# Patient Record
Sex: Female | Born: 1965 | Race: White | Hispanic: No | Marital: Married | Smoking: Never smoker
Health system: Southern US, Community
[De-identification: ages and names within clinical notes are randomized; demographics above are authoritative.]

## PROBLEM LIST (undated history)

## (undated) DIAGNOSIS — Z789 Other specified health status: Secondary | ICD-10-CM

## (undated) HISTORY — PX: WISDOM TOOTH EXTRACTION: SHX21

---

## 2005-02-18 ENCOUNTER — Ambulatory Visit: Payer: Self-pay

## 2009-11-20 ENCOUNTER — Ambulatory Visit: Payer: Self-pay

## 2012-02-27 ENCOUNTER — Ambulatory Visit: Payer: Self-pay | Admitting: Obstetrics and Gynecology

## 2014-02-18 LAB — HM PAP SMEAR: HM Pap smear: NEGATIVE

## 2014-04-08 ENCOUNTER — Ambulatory Visit: Payer: Self-pay | Admitting: Obstetrics and Gynecology

## 2014-11-28 ENCOUNTER — Other Ambulatory Visit: Payer: Self-pay | Admitting: Obstetrics and Gynecology

## 2014-11-28 ENCOUNTER — Telehealth: Payer: Self-pay | Admitting: Obstetrics and Gynecology

## 2014-11-28 DIAGNOSIS — D229 Melanocytic nevi, unspecified: Secondary | ICD-10-CM

## 2014-11-28 DIAGNOSIS — O4100X0 Oligohydramnios, unspecified trimester, not applicable or unspecified: Secondary | ICD-10-CM

## 2014-11-28 NOTE — Telephone Encounter (Signed)
Please let her know it was sent in

## 2014-11-28 NOTE — Telephone Encounter (Signed)
This pt left a msg that she was going to dr Dionne Bucy to get a mole removed and she needs [place] to send a referral ove rso her insurance will pay. Her appt is 9/30 @ 915.    (to my knowledge I thought the primary dr had to do referral... Robin)

## 2015-02-20 ENCOUNTER — Encounter: Payer: Self-pay | Admitting: *Deleted

## 2015-02-24 ENCOUNTER — Encounter: Payer: Self-pay | Admitting: Obstetrics and Gynecology

## 2015-02-24 ENCOUNTER — Ambulatory Visit (INDEPENDENT_AMBULATORY_CARE_PROVIDER_SITE_OTHER): Payer: 59 | Admitting: Obstetrics and Gynecology

## 2015-02-24 VITALS — BP 99/66 | HR 94 | Ht 65.0 in | Wt 151.0 lb

## 2015-02-24 DIAGNOSIS — Z01419 Encounter for gynecological examination (general) (routine) without abnormal findings: Secondary | ICD-10-CM | POA: Diagnosis not present

## 2015-02-24 NOTE — Progress Notes (Signed)
Subjective:   Teresa Weaver is a 49 y.o. G2P0 Caucasian female here for a routine well-woman exam.  Patient's last menstrual period was 02/08/2015.    Current complaints: skipping menses x 4 months with hot flashes PCP: me       Does need labs  Social History: Sexual: heterosexual Marital Status: married Living situation: with spouse Occupation: hairdresser Tobacco/alcohol: no tobacco use Illicit drugs: no history of illicit drug use  The following portions of the patient's history were reviewed and updated as appropriate: allergies, current medications, past family history, past medical history, past social history, past surgical history and problem list.  Past Medical History History reviewed. No pertinent past medical history.  Past Surgical History History reviewed. No pertinent past surgical history.  Gynecologic History G2P0  Patient's last menstrual period was 02/08/2015. Contraception: vasectomy Last Pap: 2015. Results were: normal Last mammogram: 2016. Results were: normal   Obstetric History OB History  Gravida Para Term Preterm AB SAB TAB Ectopic Multiple Living  2         2    # Outcome Date GA Lbr Len/2nd Weight Sex Delivery Anes PTL Lv  2 Gravida      Vag-Spont   Y  1 Gravida      Vag-Spont   Y      Current Medications No current outpatient prescriptions on file prior to visit.   No current facility-administered medications on file prior to visit.    Review of Systems Patient denies any headaches, blurred vision, shortness of breath, chest pain, abdominal pain, problems with bowel movements, urination, or intercourse.  Objective:  BP 99/66 mmHg  Pulse 94  Ht 5\' 5"  (1.651 m)  Wt 151 lb (68.493 kg)  BMI 25.13 kg/m2  LMP 02/08/2015 Physical Exam  General:  Well developed, well nourished, no acute distress. She is alert and oriented x3. Skin:  Warm and dry Neck:  Midline trachea, no thyromegaly or nodules Cardiovascular: Regular rate and  rhythm, no murmur heard Lungs:  Effort normal, all lung fields clear to auscultation bilaterally Breasts:  No dominant palpable mass, retraction, or nipple discharge Abdomen:  Soft, non tender, no hepatosplenomegaly or masses Pelvic:  External genitalia is normal in appearance.  The vagina is normal in appearance. The cervix is bulbous, no CMT.  Thin prep pap is not done . Uterus is felt to be normal size, shape, and contour.  No adnexal masses or tenderness noted. Extremities:  No swelling or varicosities noted Psych:  She has a normal mood and affect  Assessment:   Healthy well-woman exam Perimenopausal bleeding Elevated cholesterol  Plan:  Labs obtained F/U 1 year for AE, or sooner if needed Mammogram scheduled  Paz Fuentes Rockney Ghee, CNM

## 2015-02-24 NOTE — Patient Instructions (Signed)
  Place annual gynecologic exam patient instructions here.  Thank you for enrolling in [facility]. Please follow the instructions below to securely access your online medical record. MyChart allows you to send messages to your doctor, view your test results, manage appointments, and more.   How Do I Sign Up? 1. In your Internet browser, go to [employer] and enter [email or web address]. 2. Click on the Sign Up Now link in the Sign In box. You will see the New Member Sign Up page. 3. Enter your MyChart Access Code exactly as it appears below. You will not need to use this code after you've completed the sign-up process. If you do not sign up before the expiration date, you must request a new code.  MyChart Access Code: [email or web address] Expires: 04/11/2015 10:46 AM  4. Enter your Social Security Number ([id number]) and Date of Birth (mm/dd/yyyy) as indicated and click Submit. You will be taken to the next sign-up page. 5. Create a MyChart ID. This will be your MyChart login ID and cannot be changed, so think of one that is secure and easy to remember. 6. Create a MyChart password. You can change your password at any time. 7. Enter your Password Reset Question and Answer. This can be used at a later time if you forget your password.  8. Enter your e-mail address. You will receive e-mail notification when new information is available in [facility]. 9. Click Sign Up. You can now view your medical record.   Additional Information Remember, MyChart is NOT to be used for urgent needs. For medical emergencies, dial 911.

## 2015-02-25 LAB — COMPREHENSIVE METABOLIC PANEL
A/G RATIO: 2 (ref 1.1–2.5)
ALT: 24 IU/L (ref 0–32)
AST: 19 IU/L (ref 0–40)
Albumin: 4.6 g/dL (ref 3.5–5.5)
Alkaline Phosphatase: 72 IU/L (ref 39–117)
BUN/Creatinine Ratio: 18 (ref 9–23)
BUN: 12 mg/dL (ref 6–24)
Bilirubin Total: <0.2 mg/dL (ref 0.0–1.2)
CALCIUM: 9.6 mg/dL (ref 8.7–10.2)
CO2: 22 mmol/L (ref 18–29)
Chloride: 105 mmol/L (ref 96–106)
Creatinine, Ser: 0.66 mg/dL (ref 0.57–1.00)
GFR calc Af Amer: 120 mL/min/{1.73_m2} (ref >59)
GFR, EST NON AFRICAN AMERICAN: 104 mL/min/{1.73_m2} (ref >59)
GLUCOSE: 101 mg/dL — AB (ref 65–99)
Globulin, Total: 2.3 g/dL (ref 1.5–4.5)
POTASSIUM: 4.4 mmol/L (ref 3.5–5.2)
Sodium: 144 mmol/L (ref 134–144)
Total Protein: 6.9 g/dL (ref 6.0–8.5)

## 2015-02-25 LAB — LIPID PANEL
CHOL/HDL RATIO: 6.6 ratio — AB (ref 0.0–4.4)
Cholesterol, Total: 316 mg/dL — ABNORMAL HIGH (ref 100–199)
HDL: 48 mg/dL (ref >39)
LDL CALC: 214 mg/dL — AB (ref 0–99)
Triglycerides: 270 mg/dL — ABNORMAL HIGH (ref 0–149)
VLDL Cholesterol Cal: 54 mg/dL — ABNORMAL HIGH (ref 5–40)

## 2015-02-28 ENCOUNTER — Other Ambulatory Visit: Payer: Self-pay | Admitting: Obstetrics and Gynecology

## 2015-02-28 DIAGNOSIS — E782 Mixed hyperlipidemia: Secondary | ICD-10-CM

## 2015-03-14 ENCOUNTER — Telehealth: Payer: Self-pay | Admitting: *Deleted

## 2015-03-14 NOTE — Telephone Encounter (Signed)
Notified pt of her lab results, mailed them to pt

## 2015-03-14 NOTE — Telephone Encounter (Signed)
-----   Message from Joylene Igo, [place] sent at 02/28/2015  3:44 PM EST ----- Please let her know chol was NOT good. I know she wasn't fasting, but still shouldn't be this high. I want her to work hard on lowering daily cholesterol intake and weight loss, and come by for lab draw (fasting ) in 3 months to recheck. If levels don't come down, we may have to consider cholesterol medication.

## 2016-02-05 ENCOUNTER — Ambulatory Visit
Admission: RE | Admit: 2016-02-05 | Discharge: 2016-02-05 | Disposition: A | Discharge status: Home / Self Care | Payer: BLUE CROSS/BLUE SHIELD | Source: Ambulatory Visit | Attending: Obstetrics and Gynecology | Admitting: Obstetrics and Gynecology

## 2016-02-05 DIAGNOSIS — Z1231 Encounter for screening mammogram for malignant neoplasm of breast: Secondary | ICD-10-CM | POA: Insufficient documentation

## 2016-02-05 DIAGNOSIS — Z01419 Encounter for gynecological examination (general) (routine) without abnormal findings: Secondary | ICD-10-CM | POA: Diagnosis not present

## 2016-03-01 ENCOUNTER — Encounter: Payer: 59 | Admitting: Obstetrics and Gynecology

## 2016-05-24 ENCOUNTER — Encounter: Payer: Self-pay | Admitting: Obstetrics and Gynecology

## 2016-08-02 ENCOUNTER — Encounter: Payer: Self-pay | Admitting: Obstetrics and Gynecology

## 2016-08-02 ENCOUNTER — Ambulatory Visit (INDEPENDENT_AMBULATORY_CARE_PROVIDER_SITE_OTHER): Payer: BLUE CROSS/BLUE SHIELD | Admitting: Obstetrics and Gynecology

## 2016-08-02 VITALS — BP 120/76 | HR 98 | Ht 65.0 in | Wt 147.2 lb

## 2016-08-02 DIAGNOSIS — Z01419 Encounter for gynecological examination (general) (routine) without abnormal findings: Secondary | ICD-10-CM | POA: Diagnosis not present

## 2016-08-02 NOTE — Progress Notes (Signed)
Subjective:   Teresa Weaver is a 51 y.o. G35P0 Caucasian female here for a routine well-woman exam.  No LMP recorded. Patient is perimenopausal.    Current complaints: no menses in 11 months PCP: ?       does desire labs  Social History: Sexual: heterosexual Marital Status: married Living situation: with family Occupation: self-employed Tobacco/alcohol: no tobacco use Illicit drugs: no history of illicit drug use  The following portions of the patient's history were reviewed and updated as appropriate: allergies, current medications, past family history, past medical history, past social history, past surgical history and problem list.  Past Medical History History reviewed. No pertinent past medical history.  Past Surgical History History reviewed. No pertinent surgical history.  Gynecologic History G2P0  No LMP recorded. Patient is perimenopausal. Contraception: vasectomy Last Pap: 2016. Results were: normal Last mammogram: 2017. Results were: normal   Obstetric History OB History  Gravida Para Term Preterm AB Living  2         2  SAB TAB Ectopic Multiple Live Births          2    # Outcome Date GA Lbr Len/2nd Weight Sex Delivery Anes PTL Lv  2 Gravida      Vag-Spont   LIV  1 Gravida      Vag-Spont   LIV      Current Medications No current outpatient prescriptions on file prior to visit.   No current facility-administered medications on file prior to visit.     Review of Systems Patient denies any headaches, blurred vision, shortness of breath, chest pain, abdominal pain, problems with bowel movements, urination, or intercourse.  Objective:  BP 120/76   Pulse 98   Ht 5\' 5"  (1.651 m)   Wt 147 lb 3.2 oz (66.8 kg)   BMI 24.50 kg/m  Physical Exam  General:  Well developed, well nourished, no acute distress. She is alert and oriented x3. Skin:  Warm and dry Neck:  Midline trachea, no thyromegaly or nodules Cardiovascular: Regular rate and rhythm, no  murmur heard Lungs:  Effort normal, all lung fields clear to auscultation bilaterally Breasts:  No dominant palpable mass, retraction, or nipple discharge Abdomen:  Soft, non tender, no hepatosplenomegaly or masses Pelvic:  External genitalia is normal in appearance.  The vagina is normal in appearance. The cervix is bulbous, no CMT.  Thin prep pap is not done. Uterus is felt to be normal size, shape, and contour.  No adnexal masses or tenderness noted. Extremities:  No swelling or varicosities noted Psych:  She has a normal mood and affect  Assessment:   Healthy well-woman exam  Plan:   F/U 1 year for AE, or sooner if needed Mammogram just done Colonoscopy due  Teresa Weaver, CNM

## 2016-08-03 LAB — LIPID PANEL
CHOL/HDL RATIO: 4.5 ratio — AB (ref 0.0–4.4)
Cholesterol, Total: 309 mg/dL — ABNORMAL HIGH (ref 100–199)
HDL: 68 mg/dL (ref >39)
LDL CALC: 205 mg/dL — AB (ref 0–99)
Triglycerides: 178 mg/dL — ABNORMAL HIGH (ref 0–149)
VLDL CHOLESTEROL CAL: 36 mg/dL (ref 5–40)

## 2016-08-03 LAB — COMPREHENSIVE METABOLIC PANEL
ALK PHOS: 84 IU/L (ref 39–117)
ALT: 25 IU/L (ref 0–32)
AST: 23 IU/L (ref 0–40)
Albumin/Globulin Ratio: 1.9 (ref 1.2–2.2)
Albumin: 4.6 g/dL (ref 3.5–5.5)
BILIRUBIN TOTAL: 0.2 mg/dL (ref 0.0–1.2)
BUN/Creatinine Ratio: 11 (ref 9–23)
BUN: 9 mg/dL (ref 6–24)
CHLORIDE: 101 mmol/L (ref 96–106)
CO2: 25 mmol/L (ref 18–29)
Calcium: 10.1 mg/dL (ref 8.7–10.2)
Creatinine, Ser: 0.81 mg/dL (ref 0.57–1.00)
GFR calc non Af Amer: 85 mL/min/{1.73_m2} (ref >59)
GFR, EST AFRICAN AMERICAN: 98 mL/min/{1.73_m2} (ref >59)
Globulin, Total: 2.4 g/dL (ref 1.5–4.5)
Glucose: 90 mg/dL (ref 65–99)
POTASSIUM: 4.7 mmol/L (ref 3.5–5.2)
Sodium: 141 mmol/L (ref 134–144)
TOTAL PROTEIN: 7 g/dL (ref 6.0–8.5)

## 2016-08-03 LAB — FOLLICLE STIMULATING HORMONE: FSH: 94.4 m[IU]/mL

## 2016-08-06 ENCOUNTER — Telehealth: Payer: Self-pay | Admitting: *Deleted

## 2016-08-06 NOTE — Telephone Encounter (Signed)
-----   Message from Joylene Igo, [place] sent at 08/06/2016  1:50 PM EDT ----- Please let her know labs, and welcome her to menopause! Labs show she is definitely there.

## 2016-08-06 NOTE — Telephone Encounter (Signed)
Mailed all info to pt 

## 2017-01-09 ENCOUNTER — Telehealth: Payer: Self-pay | Admitting: Gastroenterology

## 2017-01-09 NOTE — Telephone Encounter (Signed)
Patient would like for you to call her today to schedule this colonoscopy. Please call

## 2017-01-09 NOTE — Telephone Encounter (Signed)
LVM for patient callback to schedule.

## 2017-01-10 ENCOUNTER — Other Ambulatory Visit: Payer: Self-pay

## 2017-01-10 ENCOUNTER — Telehealth: Payer: Self-pay

## 2017-01-10 DIAGNOSIS — Z1211 Encounter for screening for malignant neoplasm of colon: Secondary | ICD-10-CM

## 2017-01-10 DIAGNOSIS — Z1212 Encounter for screening for malignant neoplasm of rectum: Principal | ICD-10-CM

## 2017-01-10 NOTE — Telephone Encounter (Signed)
Gastroenterology Pre-Procedure Review  Request Date: 12/3 Requesting Physician: Dr. Allen Norris  PATIENT REVIEW QUESTIONS: The patient responded to the following health history questions as indicated:    1. Are you having any GI issues? no 2. Do you have a personal history of Polyps? no 3. Do you have a family history of Colon Cancer or Polyps? no 4. Diabetes Mellitus? no 5. Joint replacements in the past 12 months?no 6. Major health problems in the past 3 months?no 7. Any artificial heart valves, MVP, or defibrillator?no    MEDICATIONS & ALLERGIES:    Patient reports the following regarding taking any anticoagulation/antiplatelet therapy:   Plavix, Coumadin, Eliquis, Xarelto, Lovenox, Pradaxa, Brilinta, or Effient? no Aspirin? no  Patient confirms/reports the following medications:  No current outpatient prescriptions on file.   No current facility-administered medications for this visit.     Patient confirms/reports the following allergies:  Allergies  Allergen Reactions  . Codeine     No orders of the defined types were placed in this encounter.   AUTHORIZATION INFORMATION Primary Insurance: 1D#: Group #:  Secondary Insurance: 1D#: Group #:  SCHEDULE INFORMATION: Date: 12/3 Time: Location: [place]

## 2017-02-03 ENCOUNTER — Other Ambulatory Visit: Payer: Self-pay

## 2017-02-03 ENCOUNTER — Encounter: Payer: Self-pay | Admitting: *Deleted

## 2017-02-07 NOTE — Discharge Instructions (Signed)
General Anesthesia, Adult, Care After °These instructions provide you with information about caring for yourself after your procedure. Your health care provider may also give you more specific instructions. Your treatment has been planned according to current medical practices, but problems sometimes occur. Call your health care provider if you have any problems or questions after your procedure. °What can I expect after the procedure? °After the procedure, it is common to have: °· Vomiting. °· A sore throat. °· Mental slowness. ° °It is common to feel: °· Nauseous. °· Cold or shivery. °· Sleepy. °· Tired. °· Sore or achy, even in parts of your body where you did not have surgery. ° °Follow these instructions at home: °For at least 24 hours after the procedure: °· Do not: °? Participate in activities where you could fall or become injured. °? Drive. °? Use heavy machinery. °? Drink alcohol. °? Take sleeping pills or medicines that cause drowsiness. °? Make important decisions or sign legal documents. °? Take care of children on your own. °· Rest. °Eating and drinking °· If you vomit, drink water, juice, or soup when you can drink without vomiting. °· Drink enough fluid to keep your urine clear or pale yellow. °· Make sure you have little or no nausea before eating solid foods. °· Follow the diet recommended by your health care provider. °General instructions °· Have a responsible adult stay with you until you are awake and alert. °· Return to your normal activities as told by your health care provider. Ask your health care provider what activities are safe for you. °· Take over-the-counter and prescription medicines only as told by your health care provider. °· If you smoke, do not smoke without supervision. °· Keep all follow-up visits as told by your health care provider. This is important. °Contact a health care provider if: °· You continue to have nausea or vomiting at home, and medicines are not helpful. °· You  cannot drink fluids or start eating again. °· You cannot urinate after 8-12 hours. °· You develop a skin rash. °· You have fever. °· You have increasing redness at the site of your procedure. °Get help right away if: °· You have difficulty breathing. °· You have chest pain. °· You have unexpected bleeding. °· You feel that you are having a life-threatening or urgent problem. °This information is not intended to replace advice given to you by your health care provider. Make sure you discuss any questions you have with your health care provider. °Document Released: 06/03/2000 Document Revised: 07/31/2015 Document Reviewed: 02/09/2015 °Elsevier Interactive Patient Education © 2018 Elsevier Inc. ° °

## 2017-02-10 ENCOUNTER — Ambulatory Visit: Payer: BLUE CROSS/BLUE SHIELD | Admitting: Anesthesiology

## 2017-02-10 ENCOUNTER — Ambulatory Visit
Admission: RE | Admit: 2017-02-10 | Discharge: 2017-02-10 | Disposition: A | Discharge status: Home / Self Care | Payer: BLUE CROSS/BLUE SHIELD | Source: Ambulatory Visit | Attending: Gastroenterology | Admitting: Gastroenterology

## 2017-02-10 ENCOUNTER — Encounter
Admission: RE | Disposition: A | Discharge status: Home / Self Care | Payer: Self-pay | Source: Ambulatory Visit | Attending: Gastroenterology

## 2017-02-10 DIAGNOSIS — Z1212 Encounter for screening for malignant neoplasm of rectum: Secondary | ICD-10-CM | POA: Diagnosis not present

## 2017-02-10 DIAGNOSIS — Z1211 Encounter for screening for malignant neoplasm of colon: Secondary | ICD-10-CM | POA: Diagnosis not present

## 2017-02-10 DIAGNOSIS — K640 First degree hemorrhoids: Secondary | ICD-10-CM | POA: Diagnosis not present

## 2017-02-10 HISTORY — DX: Other specified health status: Z78.9

## 2017-02-10 HISTORY — PX: COLONOSCOPY WITH PROPOFOL: SHX5780

## 2017-02-10 SURGERY — COLONOSCOPY WITH PROPOFOL
Anesthesia: General | Wound class: Contaminated

## 2017-02-10 MED ORDER — LIDOCAINE HCL (CARDIAC) 20 MG/ML IV SOLN
INTRAVENOUS | Status: DC | PRN
  Administered 2017-02-10: 50 mg via INTRAVENOUS

## 2017-02-10 MED ORDER — LACTATED RINGERS IV SOLN
INTRAVENOUS | Status: DC
  Administered 2017-02-10: 09:00:00 via INTRAVENOUS

## 2017-02-10 MED ORDER — SODIUM CHLORIDE 0.9 % IV SOLN: INTRAVENOUS | Status: DC

## 2017-02-10 MED ORDER — PROPOFOL 10 MG/ML IV BOLUS
INTRAVENOUS | Status: DC | PRN
  Administered 2017-02-10: 100 mg via INTRAVENOUS

## 2017-02-10 SURGICAL SUPPLY — 23 items

## 2017-02-10 NOTE — Transfer of Care (Signed)
Immediate Anesthesia Transfer of Care Note  Patient: Teresa Weaver  Procedure(s) Performed: COLONOSCOPY WITH PROPOFOL (N/A )  Patient Location: PACU  Anesthesia Type: General  Level of Consciousness: awake, alert  and patient cooperative  Airway and Oxygen Therapy: Patient Spontanous Breathing and Patient connected to supplemental oxygen  Post-op Assessment: Post-op Vital signs reviewed, Patient's Cardiovascular Status Stable, Respiratory Function Stable, Patent Airway and No signs of Nausea or vomiting  Post-op Vital Signs: Reviewed and stable  Complications: No apparent anesthesia complications

## 2017-02-10 NOTE — Anesthesia Postprocedure Evaluation (Signed)
Anesthesia Post Note  Patient: Teresa Weaver  Procedure(s) Performed: COLONOSCOPY WITH PROPOFOL (N/A )  Patient location during evaluation: PACU Anesthesia Type: General Level of consciousness: awake and alert, oriented and patient cooperative Pain management: pain level controlled Vital Signs Assessment: post-procedure vital signs reviewed and stable Respiratory status: spontaneous breathing, nonlabored ventilation and respiratory function stable Cardiovascular status: blood pressure returned to baseline and stable Postop Assessment: adequate PO intake Anesthetic complications: no    Darrin Nipper

## 2017-02-10 NOTE — Anesthesia Procedure Notes (Signed)
Performed by: Naftali Carchi, CRNA Pre-anesthesia Checklist: Patient identified, Emergency Drugs available, Suction available, Timeout performed and Patient being monitored Patient Re-evaluated:Patient Re-evaluated prior to induction Oxygen Delivery Method: Nasal cannula Placement Confirmation: positive ETCO2       

## 2017-02-10 NOTE — Op Note (Signed)
[facility] Gastroenterology Patient Name: Teresa Weaver Procedure Date: 02/10/2017 10:07 AM MRN: [id number] Account #: [id number] Date of Birth: [birth year] Admit Type: Outpatient Age: 51 Room: [facility] OR ROOM 01 Gender: Female Note Status: Finalized Procedure:            Colonoscopy Indications:          Screening for colorectal malignant neoplasm Providers:            Lucilla Lame MD, MD Referring MD:         Alanda Slim. Defrancesco, MD (Referring MD) Medicines:            Propofol per Anesthesia Complications:        No immediate complications. Procedure:            Pre-Anesthesia Assessment:                       - Prior to the procedure, a History and Physical was                        performed, and patient medications and allergies were                        reviewed. The patient's tolerance of previous                        anesthesia was also reviewed. The risks and benefits of                        the procedure and the sedation options and risks were                        discussed with the patient. All questions were                        answered, and informed consent was obtained. Prior                        Anticoagulants: The patient has taken no previous                        anticoagulant or antiplatelet agents. ASA Grade                        Assessment: II - A patient with mild systemic disease.                        After reviewing the risks and benefits, the patient was                        deemed in satisfactory condition to undergo the                        procedure.                       After obtaining informed consent, the colonoscope was                        passed under direct vision. Throughout the procedure,  the patient's blood pressure, pulse, and oxygen                        saturations were monitored continuously. The [place] [id number]) was introduced  through the                        anus and advanced to the the cecum, identified by                        appendiceal orifice and ileocecal valve. The                        colonoscopy was performed without difficulty. The                        patient tolerated the procedure well. The quality of                        the bowel preparation was excellent. Findings:      The perianal and digital rectal examinations were normal.      Non-bleeding internal hemorrhoids were found during retroflexion. The       hemorrhoids were Grade I (internal hemorrhoids that do not prolapse). Impression:           - Non-bleeding internal hemorrhoids.                       - No specimens collected. Recommendation:       - Discharge patient to home.                       - Resume previous diet.                       - Continue present medications.                       - Repeat colonoscopy in 10 years for screening unless                        any change in family history or lower GI problems. Procedure Code(s):    --- Professional ---                       [phone], Colonoscopy, flexible; diagnostic, including                        collection of specimen(s) by brushing or washing, when                        performed (separate procedure) Diagnosis Code(s):    --- Professional ---                       Z12.11, Encounter for screening for malignant neoplasm                        of colon CPT copyright 2016 American Medical Association. All rights reserved. The codes documented in this report are preliminary and upon  coder review may  be revised to meet current compliance requirements. Lucilla Lame MD, MD 02/10/2017 10:26:34 AM This report has been signed electronically. Number of Addenda: 0 Note Initiated On: 02/10/2017 10:07 AM Scope Withdrawal Time: 0 hours 7 minutes 15 seconds  Total Procedure Duration: 0 hours 9 minutes 48 seconds       [facility]

## 2017-02-10 NOTE — Anesthesia Preprocedure Evaluation (Signed)
Anesthesia Evaluation  Patient identified by MRN, date of birth, ID band Patient awake    History of Anesthesia Complications Negative for: history of anesthetic complications  Airway Mallampati: I  TM Distance: >3 FB Neck ROM: Full    Dental no notable dental hx.    Pulmonary neg pulmonary ROS,    Pulmonary exam normal breath sounds clear to auscultation       Cardiovascular Exercise Tolerance: Good Normal cardiovascular exam Rhythm:Regular Rate:Normal  Hyperlipidemia    Neuro/Psych negative neurological ROS     GI/Hepatic negative GI ROS,   Endo/Other  negative endocrine ROS  Renal/GU negative Renal ROS     Musculoskeletal   Abdominal   Peds  Hematology negative hematology ROS (+)   Anesthesia Other Findings   Reproductive/Obstetrics                             Anesthesia Physical Anesthesia Plan  ASA: II  Anesthesia Plan: General   Post-op Pain Management:    Induction: Intravenous  PONV Risk Score and Plan: 2 and Propofol infusion  Airway Management Planned: Natural Airway  Additional Equipment:   Intra-op Plan:   Post-operative Plan:   Informed Consent: I have reviewed the patients History and Physical, chart, labs and discussed the procedure including the risks, benefits and alternatives for the proposed anesthesia with the patient or authorized representative who has indicated his/her understanding and acceptance.     Plan Discussed with:   Anesthesia Plan Comments:         Anesthesia Quick Evaluation

## 2017-02-10 NOTE — H&P (Signed)
   Teresa Lame, MD [address] [place], [place] [address] Phone: [phone] Fax : [phone]  Primary Care Physician:  Defrancesco, Alanda Slim, MD Primary Gastroenterologist:  Dr. Allen Norris  Pre-Procedure History & Physical: HPI:  Teresa Weaver is a 51 y.o. female is here for a screening colonoscopy.   Past Medical History:  Diagnosis Date  . Medical history non-contributory     Past Surgical History:  Procedure Laterality Date  . WISDOM TOOTH EXTRACTION      Prior to Admission medications   Not on File    Allergies as of 01/10/2017 - Review Complete 08/02/2016  Allergen Reaction Noted  . Codeine  08/02/2016    Family History  Problem Relation Age of Onset  . Cancer Maternal Aunt        brain  . Diabetes Mother   . Heart disease Mother     Social History   Socioeconomic History  . Marital status: Married    Spouse name: Not on file  . Number of children: Not on file  . Years of education: Not on file  . Highest education level: Not on file  Social Needs  . Financial resource strain: Not on file  . Food insecurity - worry: Not on file  . Food insecurity - inability: Not on file  . Transportation needs - medical: Not on file  . Transportation needs - non-medical: Not on file  Occupational History  . Not on file  Tobacco Use  . Smoking status: Never Smoker  . Smokeless tobacco: Never Used  Substance and Sexual Activity  . Alcohol use: Yes    Alcohol/week: 0.6 oz    Types: 1 Glasses of wine per week    Comment: occas  . Drug use: No  . Sexual activity: Yes    Comment: husband-vasectomy  Other Topics Concern  . Not on file  Social History Narrative  . Not on file    Review of Systems: See HPI, otherwise negative ROS  Physical Exam: BP 127/72   Pulse 63   Temp 97.7 F (36.5 C) (Temporal)   Resp 16   Ht 5\' 5"  (1.651 m)   Wt 142 lb (64.4 kg)   LMP 08/10/2015 (Approximate) Comment: preg test neg  SpO2 99%   BMI 23.63  kg/m  General:   Alert,  pleasant and cooperative in NAD Head:  Normocephalic and atraumatic. Neck:  Supple; no masses or thyromegaly. Lungs:  Clear throughout to auscultation.    Heart:  Regular rate and rhythm. Abdomen:  Soft, nontender and nondistended. Normal bowel sounds, without guarding, and without rebound.   Neurologic:  Alert and  oriented x4;  grossly normal neurologically.  Impression/Plan: Teresa Weaver is now here to undergo a screening colonoscopy.  Risks, benefits, and alternatives regarding colonoscopy have been reviewed with the patient.  Questions have been answered.  All parties agreeable.

## 2017-02-11 ENCOUNTER — Encounter: Payer: Self-pay | Admitting: Gastroenterology

## 2017-08-08 ENCOUNTER — Encounter: Payer: BLUE CROSS/BLUE SHIELD | Admitting: Obstetrics and Gynecology

## 2017-08-13 ENCOUNTER — Other Ambulatory Visit: Payer: Self-pay | Admitting: Obstetrics and Gynecology

## 2017-08-13 DIAGNOSIS — Z1231 Encounter for screening mammogram for malignant neoplasm of breast: Secondary | ICD-10-CM

## 2017-09-05 ENCOUNTER — Ambulatory Visit
Admission: RE | Admit: 2017-09-05 | Discharge: 2017-09-05 | Disposition: A | Discharge status: Home / Self Care | Payer: BLUE CROSS/BLUE SHIELD | Source: Ambulatory Visit | Attending: Obstetrics and Gynecology | Admitting: Obstetrics and Gynecology

## 2017-09-05 DIAGNOSIS — Z1231 Encounter for screening mammogram for malignant neoplasm of breast: Secondary | ICD-10-CM | POA: Insufficient documentation

## 2017-09-26 ENCOUNTER — Encounter: Payer: BLUE CROSS/BLUE SHIELD | Admitting: Obstetrics and Gynecology

## 2017-12-05 ENCOUNTER — Encounter: Payer: BLUE CROSS/BLUE SHIELD | Admitting: Obstetrics and Gynecology

## 2018-01-30 ENCOUNTER — Encounter: Payer: BLUE CROSS/BLUE SHIELD | Admitting: Obstetrics and Gynecology

## 2018-01-30 ENCOUNTER — Ambulatory Visit (INDEPENDENT_AMBULATORY_CARE_PROVIDER_SITE_OTHER): Payer: BLUE CROSS/BLUE SHIELD | Admitting: Obstetrics and Gynecology

## 2018-01-30 ENCOUNTER — Encounter: Payer: Self-pay | Admitting: Obstetrics and Gynecology

## 2018-01-30 VITALS — BP 118/78 | HR 88 | Ht 65.5 in | Wt 144.2 lb

## 2018-01-30 DIAGNOSIS — Z01419 Encounter for gynecological examination (general) (routine) without abnormal findings: Secondary | ICD-10-CM | POA: Diagnosis not present

## 2018-01-30 DIAGNOSIS — E782 Mixed hyperlipidemia: Secondary | ICD-10-CM

## 2018-01-30 NOTE — Progress Notes (Signed)
Subjective:   Teresa Weaver is a 52 y.o. G2P0 Caucasian female here for a routine well-woman exam.  Patient's last menstrual period was 08/10/2015 (approximate).    Current complaints: none PCP: me       does desire labs  Social History: Sexual: heterosexual Marital Status: married Living situation: with family Occupation: hairdresser Tobacco/alcohol: no tobacco use Illicit drugs: no history of illicit drug use  The following portions of the patient's history were reviewed and updated as appropriate: allergies, current medications, past family history, past medical history, past social history, past surgical history and problem list.  Past Medical History Past Medical History:  Diagnosis Date  . Medical history non-contributory     Past Surgical History Past Surgical History:  Procedure Laterality Date  . COLONOSCOPY WITH PROPOFOL N/A 02/10/2017   Procedure: COLONOSCOPY WITH PROPOFOL;  Surgeon: Lucilla Lame, MD;  Location: [place];  Service: Endoscopy;  Laterality: N/A;  . WISDOM TOOTH EXTRACTION      Gynecologic History G2P0  Patient's last menstrual period was 08/10/2015 (approximate). Contraception: vasectomy Last Pap: 2016. Results were: normal Last mammogram: 08/2017. Results were: normal   Obstetric History OB History  Gravida Para Term Preterm AB Living  2         2  SAB TAB Ectopic Multiple Live Births          2    # Outcome Date GA Lbr Len/2nd Weight Sex Delivery Anes PTL Lv  2 Gravida      Vag-Spont   LIV  1 Gravida      Vag-Spont   LIV    Current Medications No current outpatient medications on file prior to visit.   No current facility-administered medications on file prior to visit.     Review of Systems Patient denies any headaches, blurred vision, shortness of breath, chest pain, abdominal pain, problems with bowel movements, urination, or intercourse.  Objective:  BP 118/78   Pulse 88   Ht 5' 5.5" (1.664 m)   Wt 144 lb  3.2 oz (65.4 kg)   LMP 08/10/2015 (Approximate) Comment: preg test neg  BMI 23.63 kg/m  Physical Exam  General:  Well developed, well nourished, no acute distress. She is alert and oriented x3. Skin:  Warm and dry Neck:  Midline trachea, no thyromegaly or nodules Cardiovascular: Regular rate and rhythm, no murmur heard Lungs:  Effort normal, all lung fields clear to auscultation bilaterally Breasts:  No dominant palpable mass, retraction, or nipple discharge Abdomen:  Soft, non tender, no hepatosplenomegaly or masses Pelvic:  External genitalia is normal in appearance.  The vagina is normal in appearance. The cervix is bulbous, no CMT.  Thin prep pap is not done . Uterus is felt to be normal size, shape, and contour.  No adnexal masses or tenderness noted. Extremities:  No swelling or varicosities noted Psych:  She has a normal mood and affect  Assessment:   Healthy well-woman exam S/p menopausal state hyperlipidemia  Plan:  Labs obtained-will follow up accordingly Declined flu vaccine F/U 1 year for AE, or sooner if needed Mammogram UTD Colonoscopy done this year  Lily Velasquez Rockney Ghee, CNM

## 2018-01-31 LAB — LIPID PANEL
CHOLESTEROL TOTAL: 319 mg/dL — AB (ref 100–199)
Chol/HDL Ratio: 4.5 ratio — ABNORMAL HIGH (ref 0.0–4.4)
HDL: 71 mg/dL (ref >39)
LDL CALC: 226 mg/dL — AB (ref 0–99)
TRIGLYCERIDES: 108 mg/dL (ref 0–149)
VLDL CHOLESTEROL CAL: 22 mg/dL (ref 5–40)

## 2018-01-31 LAB — COMPREHENSIVE METABOLIC PANEL
A/G RATIO: 2.2 (ref 1.2–2.2)
ALBUMIN: 4.9 g/dL (ref 3.5–5.5)
ALK PHOS: 83 IU/L (ref 39–117)
ALT: 37 IU/L — ABNORMAL HIGH (ref 0–32)
AST: 27 IU/L (ref 0–40)
BUN / CREAT RATIO: 17 (ref 9–23)
BUN: 14 mg/dL (ref 6–24)
Bilirubin Total: 0.3 mg/dL (ref 0.0–1.2)
CALCIUM: 10 mg/dL (ref 8.7–10.2)
CO2: 23 mmol/L (ref 20–29)
CREATININE: 0.84 mg/dL (ref 0.57–1.00)
Chloride: 104 mmol/L (ref 96–106)
GFR calc Af Amer: 92 mL/min/{1.73_m2} (ref >59)
GFR, EST NON AFRICAN AMERICAN: 80 mL/min/{1.73_m2} (ref >59)
GLOBULIN, TOTAL: 2.2 g/dL (ref 1.5–4.5)
Glucose: 95 mg/dL (ref 65–99)
POTASSIUM: 4.5 mmol/L (ref 3.5–5.2)
SODIUM: 142 mmol/L (ref 134–144)
Total Protein: 7.1 g/dL (ref 6.0–8.5)

## 2018-01-31 LAB — TSH: TSH: 1.3 u[IU]/mL (ref 0.450–4.500)

## 2018-02-17 ENCOUNTER — Telehealth: Payer: Self-pay | Admitting: *Deleted

## 2018-02-17 NOTE — Telephone Encounter (Signed)
-----   Message from Joylene Igo, [place] sent at 02/17/2018  9:29 AM EST ----- Please let her know labs, and have her continue to work on lowering daily intake of high cholesterol foods.

## 2018-02-17 NOTE — Telephone Encounter (Signed)
Notified pt of labs.

## 2019-02-03 ENCOUNTER — Encounter: Payer: BLUE CROSS/BLUE SHIELD | Admitting: Obstetrics and Gynecology

## 2019-02-19 ENCOUNTER — Encounter: Payer: BLUE CROSS/BLUE SHIELD | Admitting: Obstetrics and Gynecology

## 2019-04-05 ENCOUNTER — Ambulatory Visit (INDEPENDENT_AMBULATORY_CARE_PROVIDER_SITE_OTHER): Payer: 59 | Admitting: Certified Nurse Midwife

## 2019-04-05 ENCOUNTER — Ambulatory Visit: Payer: BLUE CROSS/BLUE SHIELD

## 2019-04-05 VITALS — BP 116/68 | HR 82 | Ht 65.5 in | Wt 153.0 lb

## 2019-04-05 DIAGNOSIS — R399 Unspecified symptoms and signs involving the genitourinary system: Secondary | ICD-10-CM | POA: Diagnosis not present

## 2019-04-05 LAB — POCT URINALYSIS DIPSTICK
Bilirubin, UA: NEGATIVE
Glucose, UA: NEGATIVE
Ketones, UA: NEGATIVE
Nitrite, UA: NEGATIVE
Protein, UA: NEGATIVE
Spec Grav, UA: 1.01 (ref 1.010–1.025)
Urobilinogen, UA: 0.2 E.U./dL
pH, UA: 6.5 (ref 5.0–8.0)

## 2019-04-05 NOTE — Progress Notes (Signed)
Pt present for urine drop off due to UTI symptoms. Pt stated having frequent urination, no back pain, burning with urination and pink bloody urine.  UA was completed and documented. Urine culture completed and sent to [facility].

## 2019-04-07 ENCOUNTER — Other Ambulatory Visit: Payer: Self-pay | Admitting: Certified Nurse Midwife

## 2019-04-07 LAB — URINE CULTURE

## 2019-04-07 MED ORDER — NITROFURANTOIN MONOHYD MACRO 100 MG PO CAPS: 100.0000 mg | ORAL_CAPSULE | Freq: Two times a day (BID) | ORAL | 0 refills | Status: AC

## 2019-04-07 NOTE — Progress Notes (Signed)
Pt urine culture positive for UTI, orders placed for treatment.    Philip Aspen, CNM

## 2019-04-08 ENCOUNTER — Telehealth: Payer: Self-pay

## 2019-04-08 NOTE — Telephone Encounter (Signed)
Voicemail message left for patient- positive urine culture- macrobid sent to preferred pharmacy per AT.

## 2019-04-09 ENCOUNTER — Telehealth: Payer: Self-pay

## 2019-04-09 NOTE — Telephone Encounter (Signed)
Spoke with patient to confirm she received the voice mail message. She states she did and she has started the medicine.

## 2019-04-16 ENCOUNTER — Encounter: Payer: BLUE CROSS/BLUE SHIELD | Admitting: Certified Nurse Midwife

## 2019-04-16 ENCOUNTER — Other Ambulatory Visit (HOSPITAL_COMMUNITY)
Admission: RE | Admit: 2019-04-16 | Discharge: 2019-04-16 | Disposition: A | Discharge status: Home / Self Care | Payer: BLUE CROSS/BLUE SHIELD | Source: Ambulatory Visit | Attending: Certified Nurse Midwife | Admitting: Certified Nurse Midwife

## 2019-04-16 ENCOUNTER — Ambulatory Visit (INDEPENDENT_AMBULATORY_CARE_PROVIDER_SITE_OTHER): Payer: 59 | Admitting: Certified Nurse Midwife

## 2019-04-16 ENCOUNTER — Other Ambulatory Visit: Payer: Self-pay

## 2019-04-16 ENCOUNTER — Encounter: Payer: Self-pay | Admitting: Certified Nurse Midwife

## 2019-04-16 VITALS — BP 89/67 | HR 111 | Ht 65.5 in | Wt 148.2 lb

## 2019-04-16 DIAGNOSIS — Z124 Encounter for screening for malignant neoplasm of cervix: Secondary | ICD-10-CM

## 2019-04-16 DIAGNOSIS — Z01419 Encounter for gynecological examination (general) (routine) without abnormal findings: Secondary | ICD-10-CM | POA: Diagnosis not present

## 2019-04-16 DIAGNOSIS — Z1231 Encounter for screening mammogram for malignant neoplasm of breast: Secondary | ICD-10-CM | POA: Diagnosis not present

## 2019-04-16 NOTE — Patient Instructions (Signed)

## 2019-04-16 NOTE — Progress Notes (Signed)
GYNECOLOGY ANNUAL PREVENTATIVE CARE ENCOUNTER NOTE  History:     Teresa Weaver is a 54 y.o. G2P0 female here for a routine annual gynecologic exam.  Current complaints: none   Denies abnormal vaginal bleeding, discharge, pelvic pain, problems with intercourse or other gynecologic concerns.     Social History: Sexual: heterosexual Marital Status: married, 2 grown children Living situation: with family Occupation: hairdresser, Probation officer( writing a book)  Tobacco/alcohol: no tobacco use, occasional alcohol  Illicit drugs: no history of illicit drug use  Gynecologic History Patient's last menstrual period was 08/10/2015 (approximate). Contraception: vasectomy and menopausal  Last Pap: 2016 Results were: normal with negative HPV Last mammogram: 2019. Results were: normal  Obstetric History OB History  Gravida Para Term Preterm AB Living  2         2  SAB TAB Ectopic Multiple Live Births          2    # Outcome Date GA Lbr Len/2nd Weight Sex Delivery Anes PTL Lv  2 Gravida      Vag-Spont   LIV  1 Gravida      Vag-Spont   LIV    Past Medical History:  Diagnosis Date  . Medical history non-contributory     Past Surgical History:  Procedure Laterality Date  . COLONOSCOPY WITH PROPOFOL N/A 02/10/2017   Procedure: COLONOSCOPY WITH PROPOFOL;  Surgeon: Lucilla Lame, MD;  Location: [place];  Service: Endoscopy;  Laterality: N/A;  . WISDOM TOOTH EXTRACTION      No current outpatient medications on file prior to visit.   No current facility-administered medications on file prior to visit.    Allergies  Allergen Reactions  . Codeine Nausea And Vomiting    Social History:  reports that she has never smoked. She has never used smokeless tobacco. She reports current alcohol use of about 1.0 standard drinks of alcohol per week. She reports that she does not use drugs.  Family History  Problem Relation Age of Onset  . Cancer Maternal Aunt        brain  .  Diabetes Mother   . Heart disease Mother     The following portions of the patient's history were reviewed and updated as appropriate: allergies, current medications, past family history, past medical history, past social history, past surgical history and problem list.  Review of Systems Pertinent items noted in HPI and remainder of comprehensive ROS otherwise negative.  Physical Exam:  LMP 08/10/2015 (Approximate) Comment: preg test neg CONSTITUTIONAL: Well-developed, well-nourished female in no acute distress.  HENT:  Normocephalic, atraumatic, External right and left ear normal. Oropharynx is clear and moist EYES: Conjunctivae and EOM are normal. Pupils are equal, round, and reactive to light. No scleral icterus.  NECK: Normal range of motion, supple, no masses.  Normal thyroid.  SKIN: Skin is warm and dry. No rash noted. Not diaphoretic. No erythema. No pallor. MUSCULOSKELETAL: Normal range of motion. No tenderness.  No cyanosis, clubbing, or edema.  2+ distal pulses. NEUROLOGIC: Alert and oriented to person, place, and time. Normal reflexes, muscle tone coordination.  PSYCHIATRIC: Normal mood and affect. Normal behavior. Normal judgment and thought content. CARDIOVASCULAR: Normal heart rate noted, regular rhythm RESPIRATORY: Clear to auscultation bilaterally. Effort and breath sounds normal, no problems with respiration noted. BREASTS: Symmetric in size. No masses, tenderness, skin changes, nipple drainage, or lymphadenopathy bilaterally. ABDOMEN: Soft, no distention noted.  No tenderness, rebound or guarding.  PELVIC: Normal appearing external genitalia and urethral  meatus; normal appearing vaginal mucosa and cervix.  No abnormal discharge noted.  Pap smear obtained.  Normal uterine size, no other palpable masses, no uterine or adnexal tenderness.   Assessment and Plan:    1. Women's annual routine gynecological examination   Will follow up results of pap smear and manage  accordingly. Mammogram scheduled Labs;none  Refills: none Colonoscopy 02/10/2017 Routine preventative health maintenance measures emphasized. Please refer to After Visit Summary for other counseling recommendations.      Philip Aspen, CNM

## 2019-04-21 ENCOUNTER — Telehealth: Payer: Self-pay

## 2019-04-21 LAB — CYTOLOGY - PAP
Comment: NEGATIVE
Diagnosis: UNDETERMINED — AB
High risk HPV: NEGATIVE

## 2019-04-21 NOTE — Telephone Encounter (Signed)
Spoke with patient re Teresa Weaver instructions- ASCUS pap smear negative HPV. Patient declined information sheet. Patient will reach out if she has any further questions or concerns.

## 2020-01-03 ENCOUNTER — Ambulatory Visit
Admission: RE | Admit: 2020-01-03 | Discharge: 2020-01-03 | Disposition: A | Discharge status: Home / Self Care | Payer: 59 | Source: Ambulatory Visit | Attending: Certified Nurse Midwife | Admitting: Certified Nurse Midwife

## 2020-01-03 ENCOUNTER — Other Ambulatory Visit: Payer: Self-pay

## 2020-01-03 DIAGNOSIS — Z1231 Encounter for screening mammogram for malignant neoplasm of breast: Secondary | ICD-10-CM | POA: Diagnosis not present

## 2020-01-03 DIAGNOSIS — Z01419 Encounter for gynecological examination (general) (routine) without abnormal findings: Secondary | ICD-10-CM

## 2020-04-24 ENCOUNTER — Encounter: Payer: 59 | Admitting: Certified Nurse Midwife

## 2020-05-22 ENCOUNTER — Ambulatory Visit (INDEPENDENT_AMBULATORY_CARE_PROVIDER_SITE_OTHER): Payer: Self-pay | Admitting: Certified Nurse Midwife

## 2020-05-22 ENCOUNTER — Other Ambulatory Visit: Payer: Self-pay

## 2020-05-22 ENCOUNTER — Encounter: Payer: Self-pay | Admitting: Certified Nurse Midwife

## 2020-05-22 VITALS — BP 150/72 | HR 74 | Ht 65.0 in | Wt 138.0 lb

## 2020-05-22 DIAGNOSIS — Z01419 Encounter for gynecological examination (general) (routine) without abnormal findings: Secondary | ICD-10-CM

## 2020-05-22 DIAGNOSIS — Z1231 Encounter for screening mammogram for malignant neoplasm of breast: Secondary | ICD-10-CM

## 2020-05-22 NOTE — Progress Notes (Signed)
GYNECOLOGY ANNUAL PREVENTATIVE CARE ENCOUNTER NOTE  History:     Teresa Weaver is a 55 y.o. G2P0 female here for a routine annual gynecologic exam.  Current complaints: none.   Denies abnormal vaginal bleeding, discharge, pelvic pain, problems with intercourse or other gynecologic concerns.     Social Relationship:married ( 2 adult children) Living: with spouse Work: Scientist, forensic on book Exercise: walks her dog, not as active as she would like to be Smoke/Alcohol/drug use: occ alcohol, denies drugs and smoking  Gynecologic History Patient's last menstrual period was 08/10/2015 (approximate). Contraception: post menopausal status and vasectomy Last Pap: 04/16/19 Results were: ASCUS with negative HPV Last mammogram: 01/04/20. Results were: normal  Obstetric History OB History  Gravida Para Term Preterm AB Living  2         2  SAB IAB Ectopic Multiple Live Births          2    # Outcome Date GA Lbr Len/2nd Weight Sex Delivery Anes PTL Lv  2 Gravida      Vag-Spont   LIV  1 Gravida      Vag-Spont   LIV    Past Medical History:  Diagnosis Date  . Medical history non-contributory     Past Surgical History:  Procedure Laterality Date  . COLONOSCOPY WITH PROPOFOL N/A 02/10/2017   Procedure: COLONOSCOPY WITH PROPOFOL;  Surgeon: Lucilla Lame, MD;  Location: [place];  Service: Endoscopy;  Laterality: N/A;  . WISDOM TOOTH EXTRACTION      No current outpatient medications on file prior to visit.   No current facility-administered medications on file prior to visit.    Allergies  Allergen Reactions  . Codeine Nausea And Vomiting    Social History:  reports that she has never smoked. She has never used smokeless tobacco. She reports current alcohol use of about 1.0 standard drink of alcohol per week. She reports that she does not use drugs.  Family History  Problem Relation Age of Onset  . Cancer Maternal Aunt        brain  . Diabetes  Mother   . Heart disease Mother     The following portions of the patient's history were reviewed and updated as appropriate: allergies, current medications, past family history, past medical history, past social history, past surgical history and problem list.  Review of Systems Pertinent items noted in HPI and remainder of comprehensive ROS otherwise negative.  Physical Exam:  LMP 08/10/2015 (Approximate)  CONSTITUTIONAL: Well-developed, well-nourished female in no acute distress.  HENT:  Normocephalic, atraumatic, External right and left ear normal. Oropharynx is clear and moist EYES: Conjunctivae and EOM are normal. Pupils are equal, round, and reactive to light. No scleral icterus.  NECK: Normal range of motion, supple, no masses.  Normal thyroid.  SKIN: Skin is warm and dry. No rash noted. Not diaphoretic. No erythema. No pallor. MUSCULOSKELETAL: Normal range of motion. No tenderness.  No cyanosis, clubbing, or edema.  2+ distal pulses. NEUROLOGIC: Alert and oriented to person, place, and time. Normal reflexes, muscle tone coordination.  PSYCHIATRIC: Normal mood and affect. Normal behavior. Normal judgment and thought content. CARDIOVASCULAR: Normal heart rate noted, regular rhythm RESPIRATORY: Clear to auscultation bilaterally. Effort and breath sounds normal, no problems with respiration noted. BREASTS: Symmetric in size. No masses, tenderness, skin changes, nipple drainage, or lymphadenopathy bilaterally.  ABDOMEN: Soft, no distention noted.  No tenderness, rebound or guarding.  PELVIC: Normal appearing external genitalia and urethral meatus; normal appearing  vaginal mucosa and cervix.  No abnormal discharge noted.  Pap smear obtained.  Normal uterine size, no other palpable masses, no uterine or adnexal tenderness.  .   Assessment and Plan:    1. Women's annual routine gynecological examination  Pap: not due Mammogram :ordered Labs: declines  Refills:none Referral:  none Routine preventative health maintenance measures emphasized. Please refer to After Visit Summary for other counseling recommendations.      Philip Aspen, CNM [facility]

## 2020-05-22 NOTE — Patient Instructions (Signed)
Preventive Care 84-55 Years Old, Female Preventive care refers to lifestyle choices and visits with your health care provider that can promote health and wellness. This includes:  A yearly physical exam. This is also called an annual wellness visit.  Regular dental and eye exams.  Immunizations.  Screening for certain conditions.  Healthy lifestyle choices, such as: ? Eating a healthy diet. ? Getting regular exercise. ? Not using drugs or products that contain nicotine and tobacco. ? Limiting alcohol use. What can I expect for my preventive care visit? Physical exam Your health care provider will check your:  Height and weight. These may be used to calculate your BMI (body mass index). BMI is a measurement that tells if you are at a healthy weight.  Heart rate and blood pressure.  Body temperature.  Skin for abnormal spots. Counseling Your health care provider may ask you questions about your:  Past medical problems.  Family's medical history.  Alcohol, tobacco, and drug use.  Emotional well-being.  Home life and relationship well-being.  Sexual activity.  Diet, exercise, and sleep habits.  Work and work Statistician.  Access to firearms.  Method of birth control.  Menstrual cycle.  Pregnancy history. What immunizations do I need? Vaccines are usually given at various ages, according to a schedule. Your health care provider will recommend vaccines for you based on your age, medical history, and lifestyle or other factors, such as travel or where you work.   What tests do I need? Blood tests  Lipid and cholesterol levels. These may be checked every 5 years, or more often if you are over 3 years old.  Hepatitis C test.  Hepatitis B test. Screening  Lung cancer screening. You may have this screening every year starting at age 73 if you have a 30-pack-year history of smoking and currently smoke or have quit within the past 15 years.  Colorectal cancer  screening. ? All adults should have this screening starting at age 52 and continuing until age 17. ? Your health care provider may recommend screening at age 49 if you are at increased risk. ? You will have tests every 1-10 years, depending on your results and the type of screening test.  Diabetes screening. ? This is done by checking your blood sugar (glucose) after you have not eaten for a while (fasting). ? You may have this done every 1-3 years.  Mammogram. ? This may be done every 1-2 years. ? Talk with your health care provider about when you should start having regular mammograms. This may depend on whether you have a family history of breast cancer.  BRCA-related cancer screening. This may be done if you have a family history of breast, ovarian, tubal, or peritoneal cancers.  Pelvic exam and Pap test. ? This may be done every 3 years starting at age 10. ? Starting at age 11, this may be done every 5 years if you have a Pap test in combination with an HPV test. Other tests  STD (sexually transmitted disease) testing, if you are at risk.  Bone density scan. This is done to screen for osteoporosis. You may have this scan if you are at high risk for osteoporosis. Talk with your health care provider about your test results, treatment options, and if necessary, the need for more tests. Follow these instructions at home: Eating and drinking  Eat a diet that includes fresh fruits and vegetables, whole grains, lean protein, and low-fat dairy products.  Take vitamin and mineral supplements  as recommended by your health care provider.  Do not drink alcohol if: ? Your health care provider tells you not to drink. ? You are pregnant, may be pregnant, or are planning to become pregnant.  If you drink alcohol: ? Limit how much you have to 0-1 drink a day. ? Be aware of how much alcohol is in your drink. In the [place] one drink equals one 12 oz bottle of beer (355 mL), one 5 oz glass of  wine (148 mL), or one 1 oz glass of hard liquor (44 mL).   Lifestyle  Take daily care of your teeth and gums. Brush your teeth every morning and night with fluoride toothpaste. Floss one time each day.  Stay active. Exercise for at least 30 minutes 5 or more days each week.  Do not use any products that contain nicotine or tobacco, such as cigarettes, e-cigarettes, and chewing tobacco. If you need help quitting, ask your health care provider.  Do not use drugs.  If you are sexually active, practice safe sex. Use a condom or other form of protection to prevent STIs (sexually transmitted infections).  If you do not wish to become pregnant, use a form of birth control. If you plan to become pregnant, see your health care provider for a prepregnancy visit.  If told by your health care provider, take low-dose aspirin daily starting at age 50.  Find healthy ways to cope with stress, such as: ? Meditation, yoga, or listening to music. ? Journaling. ? Talking to a trusted person. ? Spending time with friends and family. Safety  Always wear your seat belt while driving or riding in a vehicle.  Do not drive: ? If you have been drinking alcohol. Do not ride with someone who has been drinking. ? When you are tired or distracted. ? While texting.  Wear a helmet and other protective equipment during sports activities.  If you have firearms in your house, make sure you follow all gun safety procedures. What's next?  Visit your health care provider once a year for an annual wellness visit.  Ask your health care provider how often you should have your eyes and teeth checked.  Stay up to date on all vaccines. This information is not intended to replace advice given to you by your health care provider. Make sure you discuss any questions you have with your health care provider. Document Revised: 11/30/2019 Document Reviewed: 11/06/2017 Elsevier Patient Education  2021 Elsevier Inc.  

## 2020-12-01 IMAGING — MG DIGITAL SCREENING BILAT W/ TOMO W/ CAD
8 series · 8 of 24 positions shown · non-contrast
Comparison: Previous exam(s).

CLINICAL DATA: Screening.

EXAM:
DIGITAL SCREENING BILATERAL MAMMOGRAM WITH TOMO AND CAD

[R MLO synth-2D]
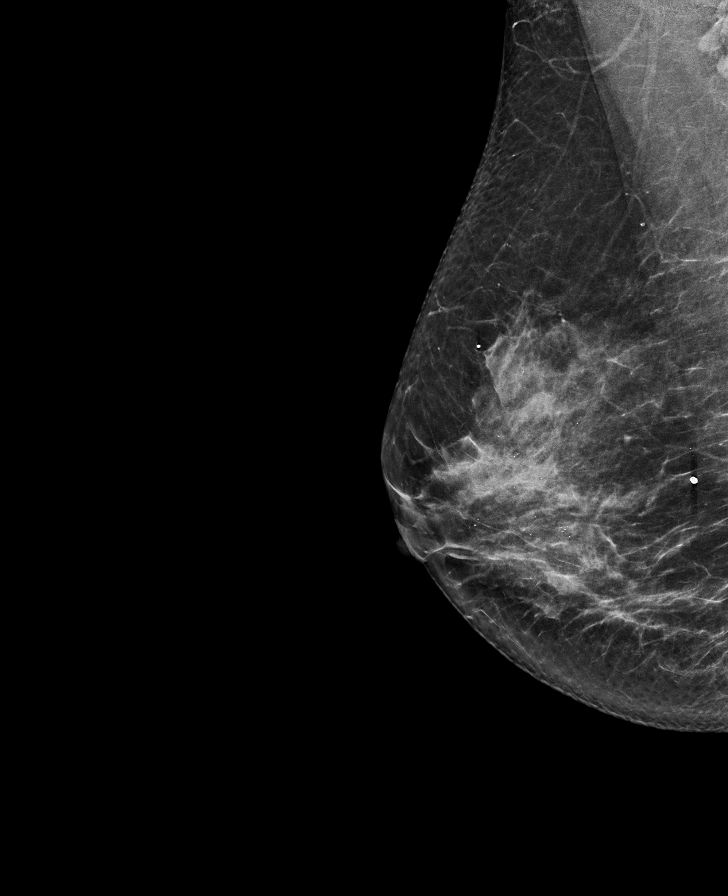

[L CC synth-2D]
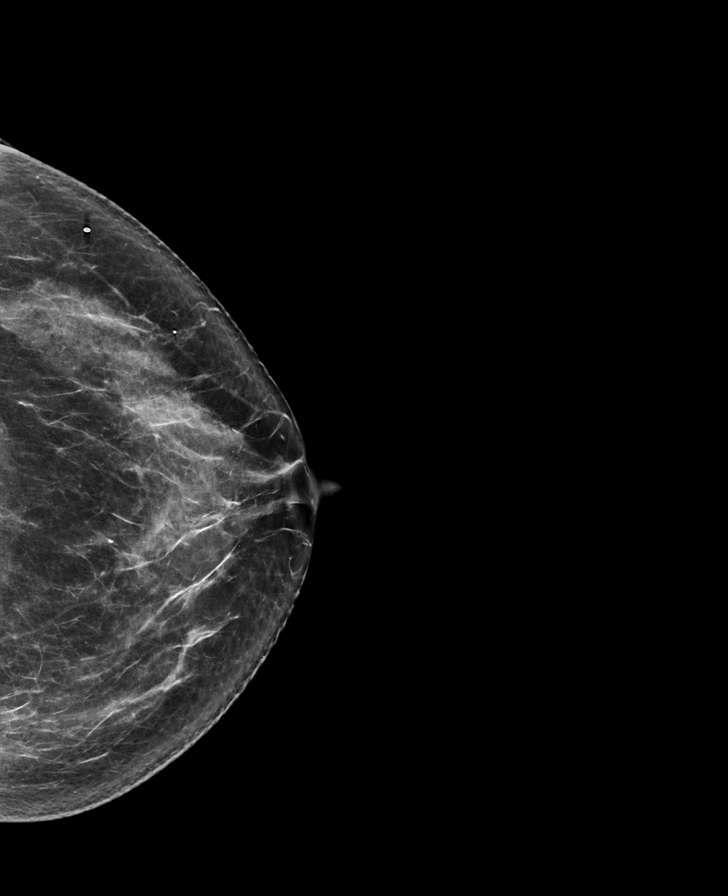

[R CC synth-2D]
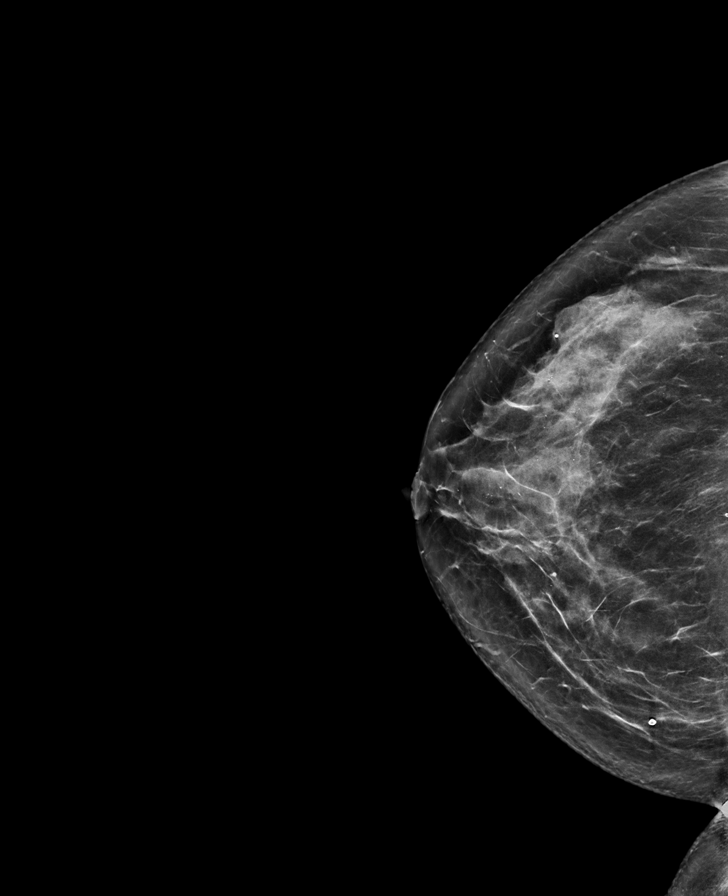

[L MLO synth-2D]
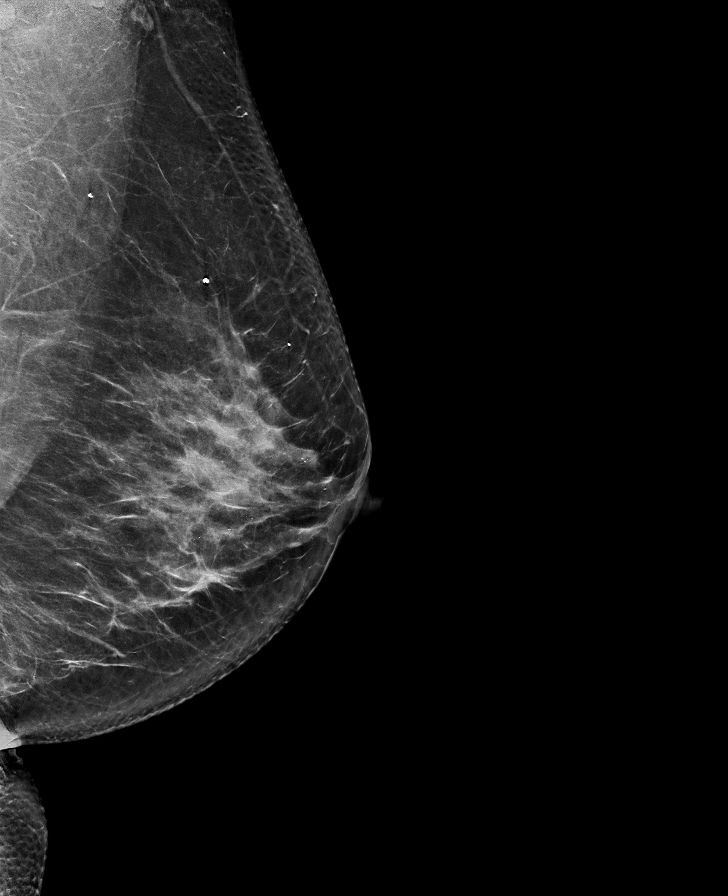

[L CC tomo · tomo slice 39/77.0]
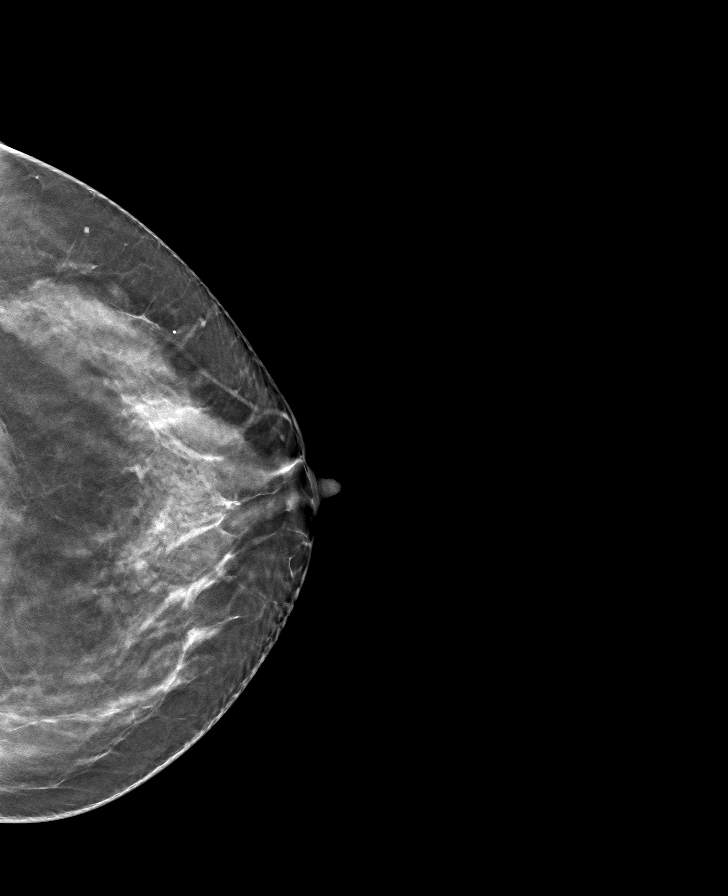

[R CC tomo · tomo slice 39/78.0]
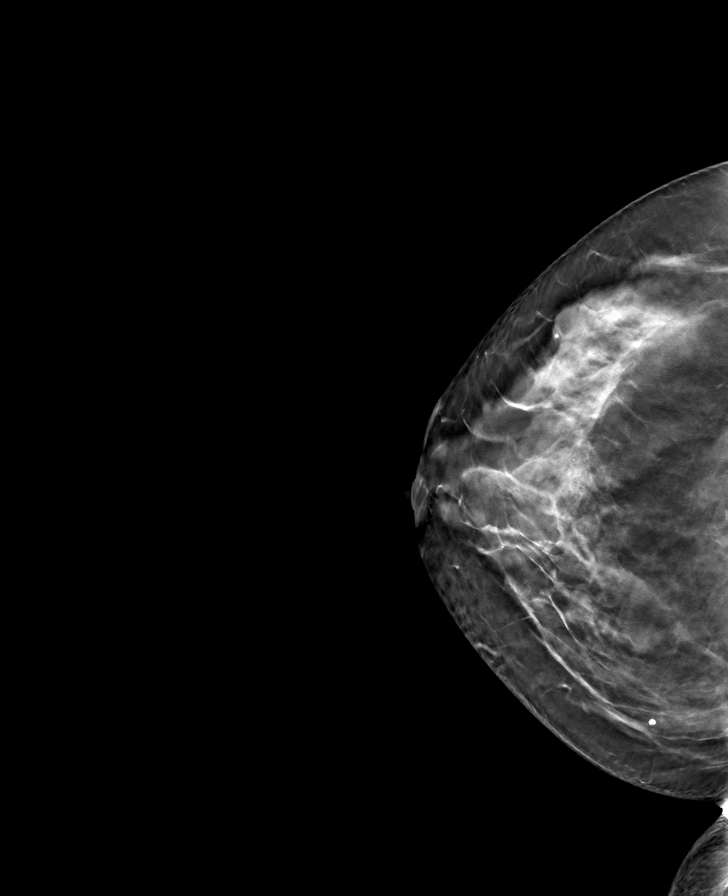

[R MLO tomo · tomo slice 36/71.0]
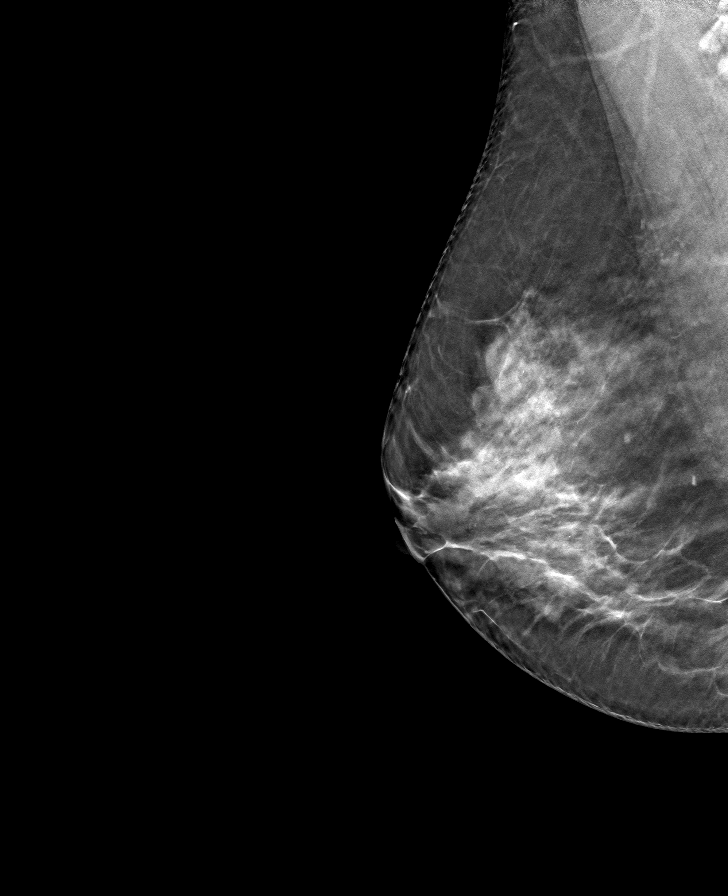

[L MLO tomo · tomo slice 41/80.0]
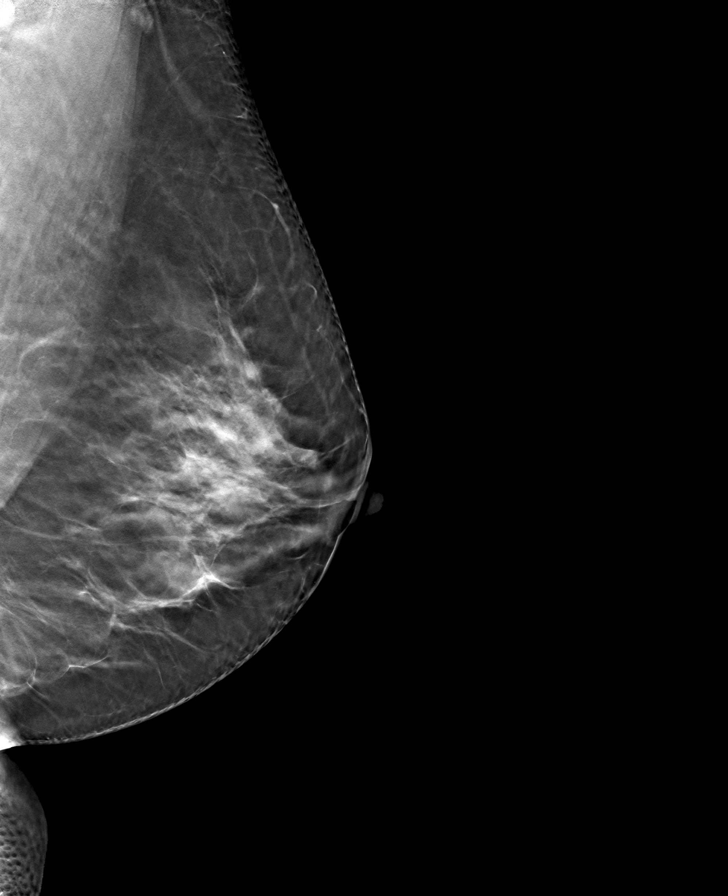

[8 of 24 positions shown; findings below may reference images not displayed]

ACR Breast Density Category c: The breast tissue is heterogeneously
dense, which may obscure small masses.
FINDINGS: There are no findings suspicious for malignancy. Images were
processed with CAD.
IMPRESSION: No mammographic evidence of malignancy. A result letter of this
screening mammogram will be mailed directly to the patient.

RECOMMENDATION:
Screening mammogram in one year. (Code:FT-U-LHB)

BI-RADS CATEGORY  1: Negative.

## 2021-05-28 ENCOUNTER — Encounter: Payer: Self-pay | Admitting: Certified Nurse Midwife

## 2021-07-30 ENCOUNTER — Encounter: Payer: Self-pay | Admitting: Certified Nurse Midwife

## 2021-10-30 ENCOUNTER — Encounter: Payer: Self-pay | Admitting: Certified Nurse Midwife

## 2021-11-26 ENCOUNTER — Ambulatory Visit: Payer: Self-pay | Admitting: Dermatology

## 2021-12-17 ENCOUNTER — Ambulatory Visit: Payer: Self-pay | Admitting: Dermatology

## 2022-02-25 ENCOUNTER — Ambulatory Visit: Payer: Self-pay | Admitting: Dermatology

## 2022-07-22 ENCOUNTER — Ambulatory Visit: Payer: Self-pay | Admitting: Dermatology

## 2023-02-14 ENCOUNTER — Encounter: Payer: Self-pay | Admitting: Certified Nurse Midwife

## 2023-02-14 ENCOUNTER — Other Ambulatory Visit (HOSPITAL_COMMUNITY)
Admission: RE | Admit: 2023-02-14 | Discharge: 2023-02-14 | Disposition: A | Discharge status: Home / Self Care | Payer: 59 | Source: Ambulatory Visit | Attending: Certified Nurse Midwife | Admitting: Certified Nurse Midwife

## 2023-02-14 ENCOUNTER — Ambulatory Visit (INDEPENDENT_AMBULATORY_CARE_PROVIDER_SITE_OTHER): Payer: 59 | Admitting: Certified Nurse Midwife

## 2023-02-14 VITALS — BP 159/108 | HR 79 | Ht 65.0 in | Wt 152.3 lb

## 2023-02-14 DIAGNOSIS — Z13220 Encounter for screening for lipoid disorders: Secondary | ICD-10-CM

## 2023-02-14 DIAGNOSIS — Z113 Encounter for screening for infections with a predominantly sexual mode of transmission: Secondary | ICD-10-CM

## 2023-02-14 DIAGNOSIS — Z01419 Encounter for gynecological examination (general) (routine) without abnormal findings: Secondary | ICD-10-CM

## 2023-02-14 DIAGNOSIS — Z131 Encounter for screening for diabetes mellitus: Secondary | ICD-10-CM

## 2023-02-14 DIAGNOSIS — Z124 Encounter for screening for malignant neoplasm of cervix: Secondary | ICD-10-CM | POA: Diagnosis not present

## 2023-02-14 DIAGNOSIS — Z23 Encounter for immunization: Secondary | ICD-10-CM

## 2023-02-14 DIAGNOSIS — Z1329 Encounter for screening for other suspected endocrine disorder: Secondary | ICD-10-CM | POA: Diagnosis not present

## 2023-02-14 DIAGNOSIS — Z130 Encounter for screening for diseases of the blood and blood-forming organs and certain disorders involving the immune mechanism: Secondary | ICD-10-CM | POA: Diagnosis not present

## 2023-02-14 DIAGNOSIS — Z1231 Encounter for screening mammogram for malignant neoplasm of breast: Secondary | ICD-10-CM

## 2023-02-14 NOTE — Patient Instructions (Signed)
Health Maintenance, Female Adopting a healthy lifestyle and getting preventive care are important in promoting health and wellness. Ask your health care provider about: The right schedule for you to have regular tests and exams. Things you can do on your own to prevent diseases and keep yourself healthy. What should I know about diet, weight, and exercise? Eat a healthy diet  Eat a diet that includes plenty of vegetables, fruits, low-fat dairy products, and lean protein. Do not eat a lot of foods that are high in solid fats, added sugars, or sodium. Maintain a healthy weight Body mass index (BMI) is used to identify weight problems. It estimates body fat based on height and weight. Your health care provider can help determine your BMI and help you achieve or maintain a healthy weight. Get regular exercise Get regular exercise. This is one of the most important things you can do for your health. Most adults should: Exercise for at least 150 minutes each week. The exercise should increase your heart rate and make you sweat (moderate-intensity exercise). Do strengthening exercises at least twice a week. This is in addition to the moderate-intensity exercise. Spend less time sitting. Even light physical activity can be beneficial. Watch cholesterol and blood lipids Have your blood tested for lipids and cholesterol at 57 years of age, then have this test every 5 years. Have your cholesterol levels checked more often if: Your lipid or cholesterol levels are high. You are older than 57 years of age. You are at high risk for heart disease. What should I know about cancer screening? Depending on your health history and family history, you may need to have cancer screening at various ages. This may include screening for: Breast cancer. Cervical cancer. Colorectal cancer. Skin cancer. Lung cancer. What should I know about heart disease, diabetes, and high blood pressure? Blood pressure and heart  disease High blood pressure causes heart disease and increases the risk of stroke. This is more likely to develop in people who have high blood pressure readings or are overweight. Have your blood pressure checked: Every 3-5 years if you are 105-58 years of age. Every year if you are 5 years old or older. Diabetes Have regular diabetes screenings. This checks your fasting blood sugar level. Have the screening done: Once every three years after age 46 if you are at a normal weight and have a low risk for diabetes. More often and at a younger age if you are overweight or have a high risk for diabetes. What should I know about preventing infection? Hepatitis B If you have a higher risk for hepatitis B, you should be screened for this virus. Talk with your health care provider to find out if you are at risk for hepatitis B infection. Hepatitis C Testing is recommended for: Everyone born from [birth year] through 1965. Anyone with known risk factors for hepatitis C. Sexually transmitted infections (STIs) Get screened for STIs, including gonorrhea and chlamydia, if: You are sexually active and are younger than 57 years of age. You are older than 57 years of age and your health care provider tells you that you are at risk for this type of infection. Your sexual activity has changed since you were last screened, and you are at increased risk for chlamydia or gonorrhea. Ask your health care provider if you are at risk. Ask your health care provider about whether you are at high risk for HIV. Your health care provider may recommend a prescription medicine to help prevent HIV  infection. If you choose to take medicine to prevent HIV, you should first get tested for HIV. You should then be tested every 3 months for as long as you are taking the medicine. Pregnancy If you are about to stop having your period (premenopausal) and you may become pregnant, seek counseling before you get pregnant. Take 400 to 800  micrograms (mcg) of folic acid every day if you become pregnant. Ask for birth control (contraception) if you want to prevent pregnancy. Osteoporosis and menopause Osteoporosis is a disease in which the bones lose minerals and strength with aging. This can result in bone fractures. If you are 52 years old or older, or if you are at risk for osteoporosis and fractures, ask your health care provider if you should: Be screened for bone loss. Take a calcium or vitamin D supplement to lower your risk of fractures. Be given hormone replacement therapy (HRT) to treat symptoms of menopause. Follow these instructions at home: Alcohol use Do not drink alcohol if: Your health care provider tells you not to drink. You are pregnant, may be pregnant, or are planning to become pregnant. If you drink alcohol: Limit how much you have to: 0-1 drink a day. Know how much alcohol is in your drink. In the [place] one drink equals one 12 oz bottle of beer (355 mL), one 5 oz glass of wine (148 mL), or one 1 oz glass of hard liquor (44 mL). Lifestyle Do not use any products that contain nicotine or tobacco. These products include cigarettes, chewing tobacco, and vaping devices, such as e-cigarettes. If you need help quitting, ask your health care provider. Do not use street drugs. Do not share needles. Ask your health care provider for help if you need support or information about quitting drugs. General instructions Schedule regular health, dental, and eye exams. Stay current with your vaccines. Tell your health care provider if: You often feel depressed. You have ever been abused or do not feel safe at home. Summary Adopting a healthy lifestyle and getting preventive care are important in promoting health and wellness. Follow your health care provider's instructions about healthy diet, exercising, and getting tested or screened for diseases. Follow your health care provider's instructions on monitoring your  cholesterol and blood pressure. This information is not intended to replace advice given to you by your health care provider. Make sure you discuss any questions you have with your health care provider. Document Revised: 07/17/2020 Document Reviewed: 07/17/2020 Elsevier Patient Education  2024 [employer].   Tdap (Tetanus, Diphtheria, Pertussis) Vaccine: What You Need to Know Many vaccine information statements are available in Spanish and other languages. See PromoAge.com.br. 1. Why get vaccinated? Tdap vaccine can prevent tetanus, diphtheria, and pertussis. Diphtheria and pertussis spread from person to person. Tetanus enters the body through cuts or wounds. TETANUS (T) causes painful stiffening of the muscles. Tetanus can lead to serious health problems, including being unable to open the mouth, having trouble swallowing and breathing, or death. DIPHTHERIA (D) can lead to difficulty breathing, heart failure, paralysis, or death. PERTUSSIS (aP), also known as "whooping cough," can cause uncontrollable, violent coughing that makes it hard to breathe, eat, or drink. Pertussis can be extremely serious especially in babies and young children, causing pneumonia, convulsions, brain damage, or death. In teens and adults, it can cause weight loss, loss of bladder control, passing out, and rib fractures from severe coughing. 2. Tdap vaccine Tdap is only for children 7 years and older, adolescents, and adults.  Adolescents should receive a single dose of Tdap, preferably at age 68 or 12 years. Pregnant people should get a dose of Tdap during every pregnancy, preferably during the early part of the third trimester, to help protect the newborn from pertussis. Infants are most at risk for severe, life-threatening complications from pertussis. Adults who have never received Tdap should get a dose of Tdap. Also, adults should receive a booster dose of either Tdap or Td (a different vaccine that protects  against tetanus and diphtheria but not pertussis) every 10 years, or after 5 years in the case of a severe or dirty wound or burn. Tdap may be given at the same time as other vaccines. 3. Talk with your health care provider Tell your vaccine provider if the person getting the vaccine: Has had an allergic reaction after a previous dose of any vaccine that protects against tetanus, diphtheria, or pertussis, or has any severe, life-threatening allergies Has had a coma, decreased level of consciousness, or prolonged seizures within 7 days after a previous dose of any pertussis vaccine (DTP, DTaP, or Tdap) Has seizures or another nervous system problem Has ever had Guillain-Barr Syndrome (also called "GBS") Has had severe pain or swelling after a previous dose of any vaccine that protects against tetanus or diphtheria In some cases, your health care provider may decide to postpone Tdap vaccination until a future visit. People with minor illnesses, such as a cold, may be vaccinated. People who are moderately or severely ill should usually wait until they recover before getting Tdap vaccine.  Your health care provider can give you more information. 4. Risks of a vaccine reaction Pain, redness, or swelling where the shot was given, mild fever, headache, feeling tired, and nausea, vomiting, diarrhea, or stomachache sometimes happen after Tdap vaccination. People sometimes faint after medical procedures, including vaccination. Tell your provider if you feel dizzy or have vision changes or ringing in the ears.  As with any medicine, there is a very remote chance of a vaccine causing a severe allergic reaction, other serious injury, or death. 5. What if there is a serious problem? An allergic reaction could occur after the vaccinated person leaves the clinic. If you see signs of a severe allergic reaction (hives, swelling of the face and throat, difficulty breathing, a fast heartbeat, dizziness, or weakness),  call 9-1-1 and get the person to the nearest hospital. For other signs that concern you, call your health care provider.  Adverse reactions should be reported to the Vaccine Adverse Event Reporting System (VAERS). Your health care provider will usually file this report, or you can do it yourself. Visit the VAERS website at www.vaers.LAgents.no or call 607-023-6463. VAERS is only for reporting reactions, and VAERS staff members do not give medical advice. 6. The National Vaccine Injury Compensation Program The [employer] Vaccine Injury Compensation Program (VICP) is a federal program that was created to compensate people who may have been injured by certain vaccines. Claims regarding alleged injury or death due to vaccination have a time limit for filing, which may be as short as two years. Visit the VICP website at SpiritualWord.at or call 601-850-1567 to learn about the program and about filing a claim. 7. How can I learn more? Ask your health care provider. Call your local or state health department. Visit the website of the Food and Drug Administration (FDA) for vaccine package inserts and additional information at FinderList.no. Contact the Centers for Disease Control and Prevention (CDC): Call 617 462 4872 (1-800-CDC-INFO) or Visit CDC's  website at PicCapture.uy. Source: CDC Vaccine Information Statement Tdap (Tetanus, Diphtheria, Pertussis) Vaccine (10/15/2019) This same material is available at FootballExhibition.com.br for no charge. This information is not intended to replace advice given to you by your health care provider. Make sure you discuss any questions you have with your health care provider. Document Revised: 06/12/2022 Document Reviewed: 04/12/2022 Elsevier Patient Education  2024 [employer].

## 2023-02-14 NOTE — Progress Notes (Signed)
GYNECOLOGY ANNUAL PREVENTATIVE CARE ENCOUNTER NOTE  History:     Raejean Juliete Shawver is a 57 y.o. G2P0 female here for a routine annual gynecologic exam.  Current complaints: none.   Denies abnormal vaginal bleeding, discharge, pelvic pain, problems with intercourse or other gynecologic concerns.     Social Relationship:married Living:living with spouse Work:full time, hiar dresser/writer working on book Exercise:stretching daily  Smoke/Alcohol/drug use: denies tobacco use/ occasional alcohol use/ denies history of drug use.   Gynecologic History Patient's last menstrual period was 08/10/2015 (approximate). Contraception: none, postmenopausal/vasectomy  Last Pap: 04/16/2019. Results were: normal with negative HPV Last mammogram: 01/03/2020. Results were: normal  Obstetric History OB History  Gravida Para Term Preterm AB Living  2         2  SAB IAB Ectopic Multiple Live Births          2    # Outcome Date GA Lbr Len/2nd Weight Sex Type Anes PTL Lv  2 Gravida      Vag-Spont   LIV  1 Gravida      Vag-Spont   LIV    Past Medical History:  Diagnosis Date   Medical history non-contributory     Past Surgical History:  Procedure Laterality Date   COLONOSCOPY WITH PROPOFOL N/A 02/10/2017   Procedure: COLONOSCOPY WITH PROPOFOL;  Surgeon: Midge Minium, MD;  Location: [facility];  Service: Endoscopy;  Laterality: N/A;   WISDOM TOOTH EXTRACTION      No current outpatient medications on file prior to visit.   No current facility-administered medications on file prior to visit.    Allergies  Allergen Reactions   Codeine Nausea And Vomiting    Social History:  reports that she has never smoked. She has never used smokeless tobacco. She reports current alcohol use of about 1.0 standard drink of alcohol per week. She reports that she does not use drugs.  Family History  Problem Relation Age of Onset   Diabetes Mother    Heart disease Mother    Cancer  Maternal Aunt        brain    The following portions of the patient's history were reviewed and updated as appropriate: allergies, current medications, past family history, past medical history, past social history, past surgical history and problem list.  Review of Systems Pertinent items noted in HPI and remainder of comprehensive ROS otherwise negative.  Physical Exam:  BP (!) 147/98   Pulse 78   Ht 5\' 5"  (1.651 m)   Wt 152 lb 4.8 oz (69.1 kg)   LMP 08/10/2015 (Approximate)   BMI 25.34 kg/m  CONSTITUTIONAL: Well-developed, well-nourished female in no acute distress.  HENT:  Normocephalic, atraumatic, External right and left ear normal. Oropharynx is clear and moist EYES: Conjunctivae and EOM are normal. Pupils are equal, round, and reactive to light. No scleral icterus.  NECK: Normal range of motion, supple, no masses.  Normal thyroid.  SKIN: Skin is warm and dry. No rash noted. Not diaphoretic. No erythema. No pallor. MUSCULOSKELETAL: Normal range of motion. No tenderness.  No cyanosis, clubbing, or edema.  2+ distal pulses. NEUROLOGIC: Alert and oriented to person, place, and time. Normal reflexes, muscle tone coordination.  PSYCHIATRIC: Normal mood and affect. Normal behavior. Normal judgment and thought content. CARDIOVASCULAR: Normal heart rate noted, regular rhythm RESPIRATORY: Clear to auscultation bilaterally. Effort and breath sounds normal, no problems with respiration noted. BREASTS: Symmetric in size. No masses, tenderness, skin changes, nipple drainage,  or lymphadenopathy bilaterally.  ABDOMEN: Soft, no distention noted.  No tenderness, rebound or guarding.  PELVIC: Normal appearing external genitalia and urethral meatus; normal appearing vaginal mucosa and cervix.  No abnormal discharge noted.  Pap smear obtained.  Contact bleeding with pap. Normal uterine size, no other palpable masses, no uterine or adnexal tenderness.  .   Assessment and Plan:    Annual [facility] Women  GYN Exam .  Pap: Will follow up results of pap smear and manage accordingly. Mammogram : ordered Labs: TSH & T4, cbc, cmp, lipid, HemA1c, HIV, hep c Refills: none TDAp given today Referral: none  Routine preventative health maintenance measures emphasized. Please refer to After Visit Summary for other counseling recommendations.      Doreene Burke, CNM [facility]  [facility],  [facility]

## 2023-02-14 NOTE — Addendum Note (Signed)
Addended by: Sheliah Hatch on: 02/14/2023 09:27 AM   Modules accepted: Orders

## 2023-02-15 LAB — TSH+FREE T4
Free T4: 0.97 ng/dL (ref 0.82–1.77)
TSH: 1.39 u[IU]/mL (ref 0.450–4.500)

## 2023-02-15 LAB — COMPREHENSIVE METABOLIC PANEL
ALT: 27 [IU]/L (ref 0–32)
AST: 21 [IU]/L (ref 0–40)
Albumin: 4.5 g/dL (ref 3.8–4.9)
Alkaline Phosphatase: 84 [IU]/L (ref 44–121)
BUN/Creatinine Ratio: 14 (ref 9–23)
BUN: 9 mg/dL (ref 6–24)
Bilirubin Total: 0.3 mg/dL (ref 0.0–1.2)
CO2: 22 mmol/L (ref 20–29)
Calcium: 9.6 mg/dL (ref 8.7–10.2)
Chloride: 106 mmol/L (ref 96–106)
Creatinine, Ser: 0.66 mg/dL (ref 0.57–1.00)
Globulin, Total: 2.3 g/dL (ref 1.5–4.5)
Glucose: 103 mg/dL — ABNORMAL HIGH (ref 70–99)
Potassium: 4.8 mmol/L (ref 3.5–5.2)
Sodium: 142 mmol/L (ref 134–144)
Total Protein: 6.8 g/dL (ref 6.0–8.5)
eGFR: 102 mL/min/{1.73_m2} (ref >59)

## 2023-02-15 LAB — CBC
Hematocrit: 41.4 % (ref 34.0–46.6)
Hemoglobin: 12.8 g/dL (ref 11.1–15.9)
MCH: 26.3 pg — ABNORMAL LOW (ref 26.6–33.0)
MCHC: 30.9 g/dL — ABNORMAL LOW (ref 31.5–35.7)
MCV: 85 fL (ref 79–97)
Platelets: 237 10*3/uL (ref 150–450)
RBC: 4.86 x10E6/uL (ref 3.77–5.28)
RDW: 13.2 % (ref 11.7–15.4)
WBC: 4.4 10*3/uL (ref 3.4–10.8)

## 2023-02-15 LAB — HEPATITIS C ANTIBODY: Hep C Virus Ab: NONREACTIVE

## 2023-02-15 LAB — LIPID PANEL
Chol/HDL Ratio: 5.7 {ratio} — ABNORMAL HIGH (ref 0.0–4.4)
Cholesterol, Total: 324 mg/dL — ABNORMAL HIGH (ref 100–199)
HDL: 57 mg/dL (ref >39)
LDL Chol Calc (NIH): 233 mg/dL — ABNORMAL HIGH (ref 0–99)
Triglycerides: 174 mg/dL — ABNORMAL HIGH (ref 0–149)
VLDL Cholesterol Cal: 34 mg/dL (ref 5–40)

## 2023-02-15 LAB — HEMOGLOBIN A1C
Est. average glucose Bld gHb Est-mCnc: 131 mg/dL
Hgb A1c MFr Bld: 6.2 % — ABNORMAL HIGH (ref 4.8–5.6)

## 2023-02-15 LAB — HIV ANTIBODY (ROUTINE TESTING W REFLEX): HIV Screen 4th Generation wRfx: NONREACTIVE

## 2023-02-16 ENCOUNTER — Encounter: Payer: Self-pay | Admitting: Certified Nurse Midwife

## 2023-02-18 ENCOUNTER — Encounter: Payer: Self-pay | Admitting: Certified Nurse Midwife

## 2023-02-18 LAB — CYTOLOGY - PAP
Comment: NEGATIVE
Diagnosis: UNDETERMINED — AB
High risk HPV: NEGATIVE

## 2023-03-06 ENCOUNTER — Ambulatory Visit
Admission: RE | Admit: 2023-03-06 | Discharge: 2023-03-06 | Disposition: A | Discharge status: Home / Self Care | Payer: 59 | Source: Ambulatory Visit | Attending: Certified Nurse Midwife | Admitting: Certified Nurse Midwife

## 2023-03-06 DIAGNOSIS — Z1231 Encounter for screening mammogram for malignant neoplasm of breast: Secondary | ICD-10-CM | POA: Insufficient documentation

## 2023-03-06 DIAGNOSIS — Z01419 Encounter for gynecological examination (general) (routine) without abnormal findings: Secondary | ICD-10-CM | POA: Diagnosis not present

## 2023-05-16 ENCOUNTER — Telehealth: Payer: Self-pay

## 2023-05-16 NOTE — Telephone Encounter (Signed)
 Dr Clent Ridges is leaving the practice and is no longer taking on new patients or transfer of care patients. Please call the office to schedule with Dr Skip Estimable.

## 2023-05-19 ENCOUNTER — Ambulatory Visit: Payer: 59 | Admitting: Family Medicine
